# Patient Record
Sex: Female | Born: 1980
Health system: Southern US, Community
[De-identification: ages and names within clinical notes are randomized; demographics above are authoritative.]

## PROBLEM LIST (undated history)

## (undated) DIAGNOSIS — E039 Hypothyroidism, unspecified: Secondary | ICD-10-CM

---

## 2017-05-06 ENCOUNTER — Ambulatory Visit: Payer: Self-pay | Admitting: Family Medicine

## 2017-06-01 DIAGNOSIS — J209 Acute bronchitis, unspecified: Secondary | ICD-10-CM | POA: Diagnosis not present

## 2017-06-01 DIAGNOSIS — J300 Vasomotor rhinitis: Secondary | ICD-10-CM | POA: Diagnosis not present

## 2017-06-09 DIAGNOSIS — Z113 Encounter for screening for infections with a predominantly sexual mode of transmission: Secondary | ICD-10-CM | POA: Diagnosis not present

## 2017-06-09 DIAGNOSIS — Z1159 Encounter for screening for other viral diseases: Secondary | ICD-10-CM | POA: Diagnosis not present

## 2017-06-09 DIAGNOSIS — Z118 Encounter for screening for other infectious and parasitic diseases: Secondary | ICD-10-CM | POA: Diagnosis not present

## 2017-06-09 DIAGNOSIS — Z31430 Encounter of female for testing for genetic disease carrier status for procreative management: Secondary | ICD-10-CM | POA: Diagnosis not present

## 2017-09-13 DIAGNOSIS — E288 Other ovarian dysfunction: Secondary | ICD-10-CM | POA: Diagnosis not present

## 2017-11-12 DIAGNOSIS — E039 Hypothyroidism, unspecified: Secondary | ICD-10-CM | POA: Diagnosis not present

## 2017-11-12 DIAGNOSIS — Z3141 Encounter for fertility testing: Secondary | ICD-10-CM | POA: Diagnosis not present

## 2018-01-04 DIAGNOSIS — E039 Hypothyroidism, unspecified: Secondary | ICD-10-CM | POA: Diagnosis not present

## 2018-01-04 DIAGNOSIS — Z3141 Encounter for fertility testing: Secondary | ICD-10-CM | POA: Diagnosis not present

## 2018-01-25 DIAGNOSIS — E288 Other ovarian dysfunction: Secondary | ICD-10-CM | POA: Diagnosis not present

## 2018-03-14 DIAGNOSIS — O020 Blighted ovum and nonhydatidiform mole: Secondary | ICD-10-CM | POA: Diagnosis not present

## 2018-03-14 DIAGNOSIS — Z3200 Encounter for pregnancy test, result unknown: Secondary | ICD-10-CM | POA: Diagnosis not present

## 2018-03-28 DIAGNOSIS — O200 Threatened abortion: Secondary | ICD-10-CM | POA: Diagnosis not present

## 2018-04-01 DIAGNOSIS — O09529 Supervision of elderly multigravida, unspecified trimester: Secondary | ICD-10-CM | POA: Diagnosis not present

## 2018-04-01 DIAGNOSIS — O09899 Supervision of other high risk pregnancies, unspecified trimester: Secondary | ICD-10-CM | POA: Diagnosis not present

## 2018-04-01 DIAGNOSIS — Z36 Encounter for antenatal screening for chromosomal anomalies: Secondary | ICD-10-CM | POA: Diagnosis not present

## 2018-04-11 DIAGNOSIS — O200 Threatened abortion: Secondary | ICD-10-CM | POA: Diagnosis not present

## 2018-05-02 DIAGNOSIS — Z01419 Encounter for gynecological examination (general) (routine) without abnormal findings: Secondary | ICD-10-CM | POA: Diagnosis not present

## 2018-05-02 DIAGNOSIS — Z0000 Encounter for general adult medical examination without abnormal findings: Secondary | ICD-10-CM | POA: Diagnosis not present

## 2018-05-02 DIAGNOSIS — O0901 Supervision of pregnancy with history of infertility, first trimester: Secondary | ICD-10-CM | POA: Diagnosis not present

## 2018-05-02 DIAGNOSIS — Z118 Encounter for screening for other infectious and parasitic diseases: Secondary | ICD-10-CM | POA: Diagnosis not present

## 2018-05-02 DIAGNOSIS — O09511 Supervision of elderly primigravida, first trimester: Secondary | ICD-10-CM | POA: Diagnosis not present

## 2018-05-02 DIAGNOSIS — Z1151 Encounter for screening for human papillomavirus (HPV): Secondary | ICD-10-CM | POA: Diagnosis not present

## 2018-05-02 DIAGNOSIS — Z3689 Encounter for other specified antenatal screening: Secondary | ICD-10-CM | POA: Diagnosis not present

## 2018-05-02 DIAGNOSIS — Z3A13 13 weeks gestation of pregnancy: Secondary | ICD-10-CM | POA: Diagnosis not present

## 2018-05-02 DIAGNOSIS — O99281 Endocrine, nutritional and metabolic diseases complicating pregnancy, first trimester: Secondary | ICD-10-CM | POA: Diagnosis not present

## 2018-05-02 DIAGNOSIS — Z23 Encounter for immunization: Secondary | ICD-10-CM | POA: Diagnosis not present

## 2018-05-02 DIAGNOSIS — Z131 Encounter for screening for diabetes mellitus: Secondary | ICD-10-CM | POA: Diagnosis not present

## 2018-05-02 LAB — OB RESULTS CONSOLE GC/CHLAMYDIA
Chlamydia: NEGATIVE
Gonorrhea: NEGATIVE

## 2018-05-02 LAB — OB RESULTS CONSOLE RPR: RPR: NONREACTIVE

## 2018-05-02 LAB — OB RESULTS CONSOLE ABO/RH: RH Type: POSITIVE

## 2018-05-02 LAB — OB RESULTS CONSOLE HIV ANTIBODY (ROUTINE TESTING): HIV: NONREACTIVE

## 2018-05-02 LAB — OB RESULTS CONSOLE ANTIBODY SCREEN: Antibody Screen: NEGATIVE

## 2018-05-02 LAB — OB RESULTS CONSOLE RUBELLA ANTIBODY, IGM: Rubella: IMMUNE

## 2018-05-02 LAB — OB RESULTS CONSOLE HEPATITIS B SURFACE ANTIGEN: Hepatitis B Surface Ag: NEGATIVE

## 2018-05-20 DIAGNOSIS — Z3A16 16 weeks gestation of pregnancy: Secondary | ICD-10-CM | POA: Diagnosis not present

## 2018-05-20 DIAGNOSIS — O99281 Endocrine, nutritional and metabolic diseases complicating pregnancy, first trimester: Secondary | ICD-10-CM | POA: Diagnosis not present

## 2018-05-20 DIAGNOSIS — Z361 Encounter for antenatal screening for raised alphafetoprotein level: Secondary | ICD-10-CM | POA: Diagnosis not present

## 2018-05-20 DIAGNOSIS — O09512 Supervision of elderly primigravida, second trimester: Secondary | ICD-10-CM | POA: Diagnosis not present

## 2018-06-10 DIAGNOSIS — O09512 Supervision of elderly primigravida, second trimester: Secondary | ICD-10-CM | POA: Diagnosis not present

## 2018-06-10 DIAGNOSIS — O99281 Endocrine, nutritional and metabolic diseases complicating pregnancy, first trimester: Secondary | ICD-10-CM | POA: Diagnosis not present

## 2018-06-10 DIAGNOSIS — Z3A19 19 weeks gestation of pregnancy: Secondary | ICD-10-CM | POA: Diagnosis not present

## 2018-07-01 DIAGNOSIS — O99281 Endocrine, nutritional and metabolic diseases complicating pregnancy, first trimester: Secondary | ICD-10-CM | POA: Diagnosis not present

## 2018-07-01 DIAGNOSIS — Z3A22 22 weeks gestation of pregnancy: Secondary | ICD-10-CM | POA: Diagnosis not present

## 2018-07-01 DIAGNOSIS — O09512 Supervision of elderly primigravida, second trimester: Secondary | ICD-10-CM | POA: Diagnosis not present

## 2018-07-29 DIAGNOSIS — O99282 Endocrine, nutritional and metabolic diseases complicating pregnancy, second trimester: Secondary | ICD-10-CM | POA: Diagnosis not present

## 2018-07-29 DIAGNOSIS — Z3A26 26 weeks gestation of pregnancy: Secondary | ICD-10-CM | POA: Diagnosis not present

## 2018-07-29 DIAGNOSIS — O09512 Supervision of elderly primigravida, second trimester: Secondary | ICD-10-CM | POA: Diagnosis not present

## 2018-08-15 DIAGNOSIS — Z23 Encounter for immunization: Secondary | ICD-10-CM | POA: Diagnosis not present

## 2018-08-15 DIAGNOSIS — O99282 Endocrine, nutritional and metabolic diseases complicating pregnancy, second trimester: Secondary | ICD-10-CM | POA: Diagnosis not present

## 2018-08-15 DIAGNOSIS — Z3689 Encounter for other specified antenatal screening: Secondary | ICD-10-CM | POA: Diagnosis not present

## 2018-08-15 DIAGNOSIS — O2613 Low weight gain in pregnancy, third trimester: Secondary | ICD-10-CM | POA: Diagnosis not present

## 2018-08-15 DIAGNOSIS — O09512 Supervision of elderly primigravida, second trimester: Secondary | ICD-10-CM | POA: Diagnosis not present

## 2018-08-15 DIAGNOSIS — Z3A28 28 weeks gestation of pregnancy: Secondary | ICD-10-CM | POA: Diagnosis not present

## 2018-08-31 DIAGNOSIS — O99283 Endocrine, nutritional and metabolic diseases complicating pregnancy, third trimester: Secondary | ICD-10-CM | POA: Diagnosis not present

## 2018-08-31 DIAGNOSIS — Z3A31 31 weeks gestation of pregnancy: Secondary | ICD-10-CM | POA: Diagnosis not present

## 2018-09-15 DIAGNOSIS — O99283 Endocrine, nutritional and metabolic diseases complicating pregnancy, third trimester: Secondary | ICD-10-CM | POA: Diagnosis not present

## 2018-09-15 DIAGNOSIS — Z3A33 33 weeks gestation of pregnancy: Secondary | ICD-10-CM | POA: Diagnosis not present

## 2018-09-28 DIAGNOSIS — Z3A35 35 weeks gestation of pregnancy: Secondary | ICD-10-CM | POA: Diagnosis not present

## 2018-09-28 DIAGNOSIS — O99283 Endocrine, nutritional and metabolic diseases complicating pregnancy, third trimester: Secondary | ICD-10-CM | POA: Diagnosis not present

## 2018-09-28 DIAGNOSIS — O365930 Maternal care for other known or suspected poor fetal growth, third trimester, not applicable or unspecified: Secondary | ICD-10-CM | POA: Diagnosis not present

## 2018-09-28 DIAGNOSIS — Z3685 Encounter for antenatal screening for Streptococcus B: Secondary | ICD-10-CM | POA: Diagnosis not present

## 2018-09-28 LAB — OB RESULTS CONSOLE GBS: GBS: NEGATIVE

## 2018-10-06 DIAGNOSIS — Z3A36 36 weeks gestation of pregnancy: Secondary | ICD-10-CM | POA: Diagnosis not present

## 2018-10-06 DIAGNOSIS — O99283 Endocrine, nutritional and metabolic diseases complicating pregnancy, third trimester: Secondary | ICD-10-CM | POA: Diagnosis not present

## 2018-10-11 DIAGNOSIS — Z3A36 36 weeks gestation of pregnancy: Secondary | ICD-10-CM | POA: Diagnosis not present

## 2018-10-11 DIAGNOSIS — O99283 Endocrine, nutritional and metabolic diseases complicating pregnancy, third trimester: Secondary | ICD-10-CM | POA: Diagnosis not present

## 2018-10-13 ENCOUNTER — Inpatient Hospital Stay (HOSPITAL_COMMUNITY)
Admission: AD | Admit: 2018-10-13 | Discharge: 2018-10-13 | Disposition: A | Discharge status: Home / Self Care | Payer: BC Managed Care – PPO | Attending: Obstetrics & Gynecology | Admitting: Obstetrics & Gynecology

## 2018-10-13 ENCOUNTER — Other Ambulatory Visit: Payer: Self-pay

## 2018-10-13 ENCOUNTER — Telehealth (HOSPITAL_COMMUNITY): Payer: Self-pay | Admitting: *Deleted

## 2018-10-13 ENCOUNTER — Encounter (HOSPITAL_COMMUNITY): Payer: Self-pay | Admitting: *Deleted

## 2018-10-13 ENCOUNTER — Inpatient Hospital Stay (HOSPITAL_BASED_OUTPATIENT_CLINIC_OR_DEPARTMENT_OTHER): Payer: BC Managed Care – PPO

## 2018-10-13 DIAGNOSIS — O26893 Other specified pregnancy related conditions, third trimester: Secondary | ICD-10-CM

## 2018-10-13 DIAGNOSIS — R030 Elevated blood-pressure reading, without diagnosis of hypertension: Secondary | ICD-10-CM | POA: Insufficient documentation

## 2018-10-13 DIAGNOSIS — N939 Abnormal uterine and vaginal bleeding, unspecified: Secondary | ICD-10-CM

## 2018-10-13 DIAGNOSIS — Z3689 Encounter for other specified antenatal screening: Secondary | ICD-10-CM

## 2018-10-13 DIAGNOSIS — O09523 Supervision of elderly multigravida, third trimester: Secondary | ICD-10-CM

## 2018-10-13 DIAGNOSIS — O4693 Antepartum hemorrhage, unspecified, third trimester: Secondary | ICD-10-CM

## 2018-10-13 DIAGNOSIS — Z3A37 37 weeks gestation of pregnancy: Secondary | ICD-10-CM

## 2018-10-13 DIAGNOSIS — O368993 Maternal care for other specified fetal problems, unspecified trimester, fetus 3: Secondary | ICD-10-CM | POA: Diagnosis not present

## 2018-10-13 HISTORY — DX: Hypothyroidism, unspecified: E03.9

## 2018-10-13 LAB — COMPREHENSIVE METABOLIC PANEL
ALT: 13 U/L (ref 0–44)
AST: 16 U/L (ref 15–41)
Albumin: 2.8 g/dL — ABNORMAL LOW (ref 3.5–5.0)
Alkaline Phosphatase: 220 U/L — ABNORMAL HIGH (ref 38–126)
Anion gap: 6 (ref 5–15)
BUN: <5 mg/dL — ABNORMAL LOW (ref 6–20)
CO2: 23 mmol/L (ref 22–32)
Calcium: 8.9 mg/dL (ref 8.9–10.3)
Chloride: 105 mmol/L (ref 98–111)
Creatinine, Ser: 0.51 mg/dL (ref 0.44–1.00)
GFR calc Af Amer: >60 mL/min (ref >60)
GFR calc non Af Amer: >60 mL/min (ref >60)
Glucose, Bld: 85 mg/dL (ref 70–99)
Potassium: 4.5 mmol/L (ref 3.5–5.1)
Sodium: 134 mmol/L — ABNORMAL LOW (ref 135–145)
Total Bilirubin: 0.4 mg/dL (ref 0.3–1.2)
Total Protein: 6.2 g/dL — ABNORMAL LOW (ref 6.5–8.1)

## 2018-10-13 LAB — URINALYSIS, ROUTINE W REFLEX MICROSCOPIC
Bilirubin Urine: NEGATIVE
Glucose, UA: NEGATIVE mg/dL
Hgb urine dipstick: NEGATIVE
Ketones, ur: NEGATIVE mg/dL
Leukocytes,Ua: NEGATIVE
Nitrite: NEGATIVE
Protein, ur: NEGATIVE mg/dL
Specific Gravity, Urine: 1.005 (ref 1.005–1.030)
pH: 6 (ref 5.0–8.0)

## 2018-10-13 LAB — CBC WITH DIFFERENTIAL/PLATELET
Abs Immature Granulocytes: 0.02 10*3/uL (ref 0.00–0.07)
Basophils Absolute: 0 10*3/uL (ref 0.0–0.1)
Basophils Relative: 0 %
Eosinophils Absolute: 0.1 10*3/uL (ref 0.0–0.5)
Eosinophils Relative: 1 %
HCT: 35.4 % — ABNORMAL LOW (ref 36.0–46.0)
Hemoglobin: 11.7 g/dL — ABNORMAL LOW (ref 12.0–15.0)
Immature Granulocytes: 0 %
Lymphocytes Relative: 21 %
Lymphs Abs: 1.8 10*3/uL (ref 0.7–4.0)
MCH: 28.5 pg (ref 26.0–34.0)
MCHC: 33.1 g/dL (ref 30.0–36.0)
MCV: 86.3 fL (ref 80.0–100.0)
Monocytes Absolute: 0.8 10*3/uL (ref 0.1–1.0)
Monocytes Relative: 9 %
Neutro Abs: 6.1 10*3/uL (ref 1.7–7.7)
Neutrophils Relative %: 69 %
Platelets: 236 10*3/uL (ref 150–400)
RBC: 4.1 MIL/uL (ref 3.87–5.11)
RDW: 13.9 % (ref 11.5–15.5)
WBC: 8.8 10*3/uL (ref 4.0–10.5)
nRBC: 0 % (ref 0.0–0.2)

## 2018-10-13 LAB — PROTEIN / CREATININE RATIO, URINE
Creatinine, Urine: 33.01 mg/dL
Total Protein, Urine: <6 mg/dL

## 2018-10-13 NOTE — MAU Note (Signed)
Returned from [place], no pain, no further bleeding.

## 2018-10-13 NOTE — MAU Note (Signed)
.   Emily Davidson is a 38 y.o. at [redacted]w[redacted]d here in MAU reporting: vaginal bleeding that started this morning. States she soaked one pad earlier but is only having vaginal bleeding when she wipes.   Onset of complaint: this morning Pain score: 0 Vitals:   10/13/18 1438  BP: (!) 148/91  Pulse: 99  Resp: 16  Temp: 98 F (36.7 C)     [id number]  Lab orders placed from triage:

## 2018-10-13 NOTE — Discharge Instructions (Signed)

## 2018-10-13 NOTE — MAU Provider Note (Signed)
History     CSN: [id number]  Arrival date and time: 10/13/18 1418   First Provider Initiated Contact with Patient 10/13/18 1449      Chief Complaint  Patient presents with  . Vaginal Bleeding   HPI Lincoln Kleiner is a 38 y.o. G1P0 at [redacted]w[redacted]d who presents to MAU with chief complaint of vaginal bleeding x 1 episode. This is a new problem, onset today around 0500. Patient states she "woke up to pee" and "saturated the toilet paper" with blood when she wiped after voiding. She denies ongoing bleeding and has not noticed any additional bleeding on her underwear. She did not wear a pad or pantiliner. She denies recent sexual intercourse.   Patient also reports RLQ tenderness to self-palpation. She denies leaking of fluid, decreased fetal movement, fever, falls, or recent illness.    Patient's recent OB appointment was 10/11/2018 but she states she did not have a cervical exam at that time.  Patient's blood pressure is intermittently elevated in MAU. She denies history of elevated blood pressure. She denies headache, visual disturbances, RUQ/upper abdominal pain, new onset swelling or weight gain.  OB History    Gravida  1   Para      Term      Preterm      AB      Living        SAB      TAB      Ectopic      Multiple      Live Births              Past Medical History:  Diagnosis Date  . Hypothyroidism     History reviewed. No pertinent surgical history.  History reviewed. No pertinent family history.  Social History   Tobacco Use  . Smoking status: Never Smoker  . Smokeless tobacco: Never Used  Substance Use Topics  . Alcohol use: Never    Frequency: Never  . Drug use: Never    Allergies: No Known Allergies  Medications Prior to Admission  Medication Sig Dispense Refill Last Dose  . chlorproMAZINE (THORAZINE) 50 MG tablet Take 50 mg by mouth 3 (three) times daily as needed.   10/13/2018 at Unknown time  . Prenatal Vit-Fe Fumarate-FA (PRENATAL  MULTIVITAMIN) TABS tablet Take 1 tablet by mouth daily at 12 noon.   10/13/2018 at Unknown time    Review of Systems  Constitutional: Negative for chills, fatigue and fever.  Gastrointestinal: Positive for abdominal pain. Negative for nausea and vomiting.  Genitourinary: Positive for vaginal bleeding. Negative for difficulty urinating, dysuria, flank pain, vaginal discharge and vaginal pain.  Musculoskeletal: Negative for back pain.  Neurological: Negative for headaches.  All other systems reviewed and are negative.  Physical Exam   Blood pressure 130/87, pulse 99, temperature 98 F (36.7 C), resp. rate 16, height  (1.549 m), weight 76.7 kg.  Physical Exam  Nursing note and vitals reviewed. Constitutional: She is oriented to person, place, and time. She appears well-developed and well-nourished.  Cardiovascular: Normal rate.  Respiratory: Effort normal. No respiratory distress.  GI: She exhibits no distension. There is no abdominal tenderness. There is no rebound and no guarding.  Gravid  Genitourinary:    Vagina and uterus normal.     No vaginal discharge.     Genitourinary Comments: No bleeding or abnormal findings on pelvic exam   Neurological: She is alert and oriented to person, place, and time.  Skin: Skin is warm and dry.  Psychiatric: She has a normal mood and affect. Her behavior is normal. Judgment and thought content normal.    MAU Course/MDM  Procedures: sterile speculum exam  --Reactive tracing: baseline 120, mod variability, + accels, no decels --Toco: UI --Questionable decel at 1527, sent for BPP. BPP 8/8 --Closed cervix --Prenatal record reviewed by me. No history of elevated blood pressure  Patient Vitals for the past 24 hrs:  BP Temp Pulse Resp Height Weight  10/13/18 1613 (!) 143/82 - 80 - - -  10/13/18 1515 123/79 - 84 - - -  10/13/18 1501 - - 95 - - -  10/13/18 1500 (!) 109/51 - - - - -  10/13/18 1441 130/87 - - - - -  10/13/18 1438 (!) 148/91  98 F (36.7 C) 99 16 5\' 1"  (1.549 m) 76.7 kg    Results for orders placed or performed during the hospital encounter of 10/13/18 (from the past 24 hour(s))  Protein / creatinine ratio, urine     Status: None   Collection Time: 10/13/18  2:44 PM  Result Value Ref Range   Creatinine, Urine 33.01 mg/dL   Total Protein, Urine <6 mg/dL   Protein Creatinine Ratio        0.00 - 0.15 mg/mg[Cre]  Urinalysis, Routine w reflex microscopic     Status: Abnormal   Collection Time: 10/13/18  2:44 PM  Result Value Ref Range   Color, Urine STRAW (A) YELLOW   APPearance CLEAR CLEAR   Specific Gravity, Urine 1.005 1.005 - 1.030   pH 6.0 5.0 - 8.0   Glucose, UA NEGATIVE NEGATIVE mg/dL   Hgb urine dipstick NEGATIVE NEGATIVE   Bilirubin Urine NEGATIVE NEGATIVE   Ketones, ur NEGATIVE NEGATIVE mg/dL   Protein, ur NEGATIVE NEGATIVE mg/dL   Nitrite NEGATIVE NEGATIVE   Leukocytes,Ua NEGATIVE NEGATIVE  CBC with Differential/Platelet     Status: Abnormal   Collection Time: 10/13/18  3:10 PM  Result Value Ref Range   WBC 8.8 4.0 - 10.5 K/uL   RBC 4.10 3.87 - 5.11 MIL/uL   Hemoglobin 11.7 (L) 12.0 - 15.0 g/dL   HCT 19.135.4 (L) 47.836.0 - 29.546.0 %   MCV 86.3 80.0 - 100.0 fL   MCH 28.5 26.0 - 34.0 pg   MCHC 33.1 30.0 - 36.0 g/dL   RDW 62.113.9 30.811.5 - 65.715.5 %   Platelets 236 150 - 400 K/uL   nRBC 0.0 0.0 - 0.2 %   Neutrophils Relative % 69 %   Neutro Abs 6.1 1.7 - 7.7 K/uL   Lymphocytes Relative 21 %   Lymphs Abs 1.8 0.7 - 4.0 K/uL   Monocytes Relative 9 %   Monocytes Absolute 0.8 0.1 - 1.0 K/uL   Eosinophils Relative 1 %   Eosinophils Absolute 0.1 0.0 - 0.5 K/uL   Basophils Relative 0 %   Basophils Absolute 0.0 0.0 - 0.1 K/uL   Immature Granulocytes 0 %   Abs Immature Granulocytes 0.02 0.00 - 0.07 K/uL  Comprehensive metabolic panel     Status: Abnormal   Collection Time: 10/13/18  3:10 PM  Result Value Ref Range   Sodium 134 (L) 135 - 145 mmol/L   Potassium 4.5 3.5 - 5.1 mmol/L   Chloride 105 98 - 111 mmol/L    CO2 23 22 - 32 mmol/L   Glucose, Bld 85 70 - 99 mg/dL   BUN <5 (L) 6 - 20 mg/dL   Creatinine, Ser 8.460.51 0.44 - 1.00 mg/dL   Calcium 8.9 8.9 -  10.3 mg/dL   Total Protein 6.2 (L) 6.5 - 8.1 g/dL   Albumin 2.8 (L) 3.5 - 5.0 g/dL   AST 16 15 - 41 U/L   ALT 13 0 - 44 U/L   Alkaline Phosphatase 220 (H) 38 - 126 U/L   Total Bilirubin 0.4 0.3 - 1.2 mg/dL   GFR calc non Af Amer >60 >60 mL/min   GFR calc Af Amer >60 >60 mL/min   Anion gap 6 5 - 15    Assessment and Plan  --38 y.o. G1P0 at [redacted]w[redacted]d  --Reactive tracing --Elevated BP x 2 in MAU. Normal PEC labs, no severe signs or symptoms --BPP 8/8, no concerning findings on preliminary MFM report --Discharge home in stable condition, return for increasing acuity  F/U: Patient has OB appt 10/19/2018  Calvert Cantor 10/13/2018, 5:08 PM

## 2018-10-14 DIAGNOSIS — O99283 Endocrine, nutritional and metabolic diseases complicating pregnancy, third trimester: Secondary | ICD-10-CM | POA: Diagnosis not present

## 2018-10-14 DIAGNOSIS — Z3A37 37 weeks gestation of pregnancy: Secondary | ICD-10-CM | POA: Diagnosis not present

## 2018-10-17 ENCOUNTER — Encounter (HOSPITAL_COMMUNITY): Payer: Self-pay | Admitting: *Deleted

## 2018-10-17 ENCOUNTER — Inpatient Hospital Stay (HOSPITAL_COMMUNITY)
Admission: AD | Admit: 2018-10-17 | Discharge: 2018-10-21 | DRG: 788 | Disposition: A | Discharge status: Home / Self Care | Payer: BC Managed Care – PPO | Attending: Obstetrics & Gynecology | Admitting: Obstetrics & Gynecology

## 2018-10-17 ENCOUNTER — Other Ambulatory Visit: Payer: Self-pay

## 2018-10-17 DIAGNOSIS — O134 Gestational [pregnancy-induced] hypertension without significant proteinuria, complicating childbirth: Secondary | ICD-10-CM | POA: Diagnosis not present

## 2018-10-17 DIAGNOSIS — M25561 Pain in right knee: Secondary | ICD-10-CM | POA: Diagnosis not present

## 2018-10-17 DIAGNOSIS — E039 Hypothyroidism, unspecified: Secondary | ICD-10-CM | POA: Diagnosis not present

## 2018-10-17 DIAGNOSIS — O09513 Supervision of elderly primigravida, third trimester: Secondary | ICD-10-CM | POA: Diagnosis not present

## 2018-10-17 DIAGNOSIS — O9089 Other complications of the puerperium, not elsewhere classified: Secondary | ICD-10-CM | POA: Diagnosis not present

## 2018-10-17 DIAGNOSIS — O99284 Endocrine, nutritional and metabolic diseases complicating childbirth: Secondary | ICD-10-CM | POA: Diagnosis not present

## 2018-10-17 DIAGNOSIS — Z1159 Encounter for screening for other viral diseases: Secondary | ICD-10-CM | POA: Diagnosis not present

## 2018-10-17 DIAGNOSIS — O133 Gestational [pregnancy-induced] hypertension without significant proteinuria, third trimester: Secondary | ICD-10-CM | POA: Diagnosis not present

## 2018-10-17 DIAGNOSIS — O365930 Maternal care for other known or suspected poor fetal growth, third trimester, not applicable or unspecified: Secondary | ICD-10-CM | POA: Diagnosis not present

## 2018-10-17 DIAGNOSIS — Z3A37 37 weeks gestation of pregnancy: Secondary | ICD-10-CM

## 2018-10-17 DIAGNOSIS — Z98891 History of uterine scar from previous surgery: Secondary | ICD-10-CM

## 2018-10-17 DIAGNOSIS — O139 Gestational [pregnancy-induced] hypertension without significant proteinuria, unspecified trimester: Secondary | ICD-10-CM | POA: Diagnosis present

## 2018-10-17 DIAGNOSIS — O43813 Placental infarction, third trimester: Secondary | ICD-10-CM | POA: Diagnosis not present

## 2018-10-17 LAB — TYPE AND SCREEN
ABO/RH(D): B POS
Antibody Screen: NEGATIVE

## 2018-10-17 LAB — CBC
HCT: 37.3 % (ref 36.0–46.0)
HCT: 38.5 % (ref 36.0–46.0)
Hemoglobin: 12.5 g/dL (ref 12.0–15.0)
Hemoglobin: 12.7 g/dL (ref 12.0–15.0)
MCH: 28.9 pg (ref 26.0–34.0)
MCH: 28.6 pg (ref 26.0–34.0)
MCHC: 33.5 g/dL (ref 30.0–36.0)
MCHC: 33 g/dL (ref 30.0–36.0)
MCV: 86.3 fL (ref 80.0–100.0)
MCV: 86.7 fL (ref 80.0–100.0)
Platelets: 267 10*3/uL (ref 150–400)
Platelets: 239 10*3/uL (ref 150–400)
RBC: 4.32 MIL/uL (ref 3.87–5.11)
RBC: 4.44 MIL/uL (ref 3.87–5.11)
RDW: 13.8 % (ref 11.5–15.5)
RDW: 13.9 % (ref 11.5–15.5)
WBC: 9.7 10*3/uL (ref 4.0–10.5)
WBC: 9.8 10*3/uL (ref 4.0–10.5)
nRBC: 0 % (ref 0.0–0.2)
nRBC: 0 % (ref 0.0–0.2)

## 2018-10-17 LAB — COMPREHENSIVE METABOLIC PANEL
ALT: 14 U/L (ref 0–44)
AST: 20 U/L (ref 15–41)
Albumin: 2.9 g/dL — ABNORMAL LOW (ref 3.5–5.0)
Alkaline Phosphatase: 226 U/L — ABNORMAL HIGH (ref 38–126)
Anion gap: 10 (ref 5–15)
BUN: 6 mg/dL (ref 6–20)
CO2: 21 mmol/L — ABNORMAL LOW (ref 22–32)
Calcium: 9.5 mg/dL (ref 8.9–10.3)
Chloride: 104 mmol/L (ref 98–111)
Creatinine, Ser: 0.51 mg/dL (ref 0.44–1.00)
GFR calc Af Amer: >60 mL/min (ref >60)
GFR calc non Af Amer: >60 mL/min (ref >60)
Glucose, Bld: 89 mg/dL (ref 70–99)
Potassium: 4.5 mmol/L (ref 3.5–5.1)
Sodium: 135 mmol/L (ref 135–145)
Total Bilirubin: 0.3 mg/dL (ref 0.3–1.2)
Total Protein: 6.5 g/dL (ref 6.5–8.1)

## 2018-10-17 LAB — ABO/RH: ABO/RH(D): B POS

## 2018-10-17 LAB — PROTEIN / CREATININE RATIO, URINE
Creatinine, Urine: 34.24 mg/dL
Protein Creatinine Ratio: 0.26 mg/mg{Cre} — ABNORMAL HIGH (ref 0.00–0.15)
Total Protein, Urine: 9 mg/dL

## 2018-10-17 LAB — SARS CORONAVIRUS 2 BY RT PCR (HOSPITAL ORDER, PERFORMED IN ~~LOC~~ HOSPITAL LAB): SARS Coronavirus 2: NEGATIVE

## 2018-10-17 MED ORDER — ONDANSETRON HCL 4 MG/2ML IJ SOLN: 4.0000 mg | Freq: Four times a day (QID) | INTRAMUSCULAR | Status: DC | PRN

## 2018-10-17 MED ORDER — OXYTOCIN 40 UNITS IN NORMAL SALINE INFUSION - SIMPLE MED
1.0000 m[IU]/min | INTRAVENOUS | Status: DC
  Administered 2018-10-17: 23:00:00 2 m[IU]/min via INTRAVENOUS
  Filled 2018-10-17: qty 1000

## 2018-10-17 MED ORDER — SOD CITRATE-CITRIC ACID 500-334 MG/5ML PO SOLN
30.0000 mL | ORAL | Status: DC | PRN
  Administered 2018-10-18: 01:00:00 30 mL via ORAL
  Filled 2018-10-17: qty 30

## 2018-10-17 MED ORDER — LACTATED RINGERS IV SOLN
INTRAVENOUS | Status: DC
  Administered 2018-10-17 – 2018-10-18 (×4): via INTRAVENOUS

## 2018-10-17 MED ORDER — PROMETHAZINE HCL 25 MG/ML IJ SOLN
12.5000 mg | Freq: Four times a day (QID) | INTRAMUSCULAR | Status: DC | PRN
  Administered 2018-10-17: 20:00:00 12.5 mg via INTRAVENOUS
  Filled 2018-10-17: qty 1

## 2018-10-17 MED ORDER — MISOPROSTOL 25 MCG QUARTER TABLET
25.0000 ug | ORAL_TABLET | ORAL | Status: DC | PRN
  Administered 2018-10-17 (×2): 25 ug via VAGINAL
  Filled 2018-10-17 (×2): qty 1

## 2018-10-17 MED ORDER — EPHEDRINE 5 MG/ML INJ: 10.0000 mg | INTRAVENOUS | Status: DC | PRN

## 2018-10-17 MED ORDER — LIDOCAINE HCL (PF) 1 % IJ SOLN: 30.0000 mL | INTRAMUSCULAR | Status: DC | PRN

## 2018-10-17 MED ORDER — OXYCODONE-ACETAMINOPHEN 5-325 MG PO TABS: 1.0000 | ORAL_TABLET | ORAL | Status: DC | PRN

## 2018-10-17 MED ORDER — LACTATED RINGERS IV SOLN
500.0000 mL | INTRAVENOUS | Status: DC | PRN
  Administered 2018-10-17: 16:00:00 500 mL via INTRAVENOUS

## 2018-10-17 MED ORDER — OXYTOCIN 40 UNITS IN NORMAL SALINE INFUSION - SIMPLE MED: 2.5000 [IU]/h | INTRAVENOUS | Status: DC

## 2018-10-17 MED ORDER — NALBUPHINE HCL 10 MG/ML IJ SOLN
10.0000 mg | INTRAMUSCULAR | Status: DC | PRN
  Administered 2018-10-17: 20:00:00 10 mg via INTRAVENOUS
  Filled 2018-10-17: qty 1

## 2018-10-17 MED ORDER — LACTATED RINGERS IV SOLN: 500.0000 mL | Freq: Once | INTRAVENOUS | Status: DC

## 2018-10-17 MED ORDER — PHENYLEPHRINE 40 MCG/ML (10ML) SYRINGE FOR IV PUSH (FOR BLOOD PRESSURE SUPPORT): 80.0000 ug | PREFILLED_SYRINGE | INTRAVENOUS | Status: DC | PRN

## 2018-10-17 MED ORDER — FENTANYL-BUPIVACAINE-NACL 0.5-0.125-0.9 MG/250ML-% EP SOLN: 12.0000 mL/h | EPIDURAL | Status: DC | PRN

## 2018-10-17 MED ORDER — TERBUTALINE SULFATE 1 MG/ML IJ SOLN: 0.2500 mg | Freq: Once | INTRAMUSCULAR | Status: DC | PRN

## 2018-10-17 MED ORDER — OXYCODONE-ACETAMINOPHEN 5-325 MG PO TABS: 2.0000 | ORAL_TABLET | ORAL | Status: DC | PRN

## 2018-10-17 MED ORDER — OXYTOCIN BOLUS FROM INFUSION: 500.0000 mL | Freq: Once | INTRAVENOUS | Status: DC

## 2018-10-17 MED ORDER — ACETAMINOPHEN 325 MG PO TABS: 650.0000 mg | ORAL_TABLET | ORAL | Status: DC | PRN

## 2018-10-17 MED ORDER — DIPHENHYDRAMINE HCL 50 MG/ML IJ SOLN: 12.5000 mg | INTRAMUSCULAR | Status: DC | PRN

## 2018-10-17 NOTE — Progress Notes (Signed)
No complaints  BP 140/77   Pulse 71   Temp (!) 97.2 F (36.2 C) (Axillary)   Resp 18  Patient Vitals for the past 24 hrs:  BP Temp Temp src Pulse Resp  10/17/18 1705 140/77 - - 71 -  10/17/18 1700 - - - - 18  10/17/18 1500 126/79 (!) 97.2 F (36.2 C) Axillary 67 20  10/17/18 1354 (!) 146/80 (!) 97 F (36.1 C) Oral 72 20  10/17/18 1344 - - - - 18  10/17/18 1340 (!) 135/92 - - 79 18   CBC Latest Ref Rng & Units 10/17/2018 10/13/2018  WBC 4.0 - 10.5 K/uL 9.7 8.8  Hemoglobin 12.0 - 15.0 g/dL 12.5 11.7(L)  Hematocrit 36.0 - 46.0 % 37.3 35.4(L)  Platelets 150 - 400 K/uL 267 236    CMP Latest Ref Rng & Units 10/17/2018 10/13/2018  Glucose 70 - 99 mg/dL 89 85  BUN 6 - 20 mg/dL 6 <5(L)  Creatinine 0.44 - 1.00 mg/dL 0.51 0.51  Sodium 135 - 145 mmol/L 135 134(L)  Potassium 3.5 - 5.1 mmol/L 4.5 4.5  Chloride 98 - 111 mmol/L 104 105  CO2 22 - 32 mmol/L 21(L) 23  Calcium 8.9 - 10.3 mg/dL 9.5 8.9  Total Protein 6.5 - 8.1 g/dL 6.5 6.2(L)  Total Bilirubin 0.3 - 1.2 mg/dL 0.3 0.4  Alkaline Phos 38 - 126 U/L 226(H) 220(H)  AST 15 - 41 U/L 20 16  ALT 0 - 44 U/L 14 13   FHT one spontaneous 3 min decel around 3.45 pm, 3 minutes, after RTB normal moderate variability and accels and no further decels noted. Overall cat I Toco none  SVE def- cx closed at admission, cytotec placed by RN  A/P: IOL for 38 yo, G1 with GHTN.  PIH labs nl, no PEC. No neural s/s.  Borderline pelvis

## 2018-10-17 NOTE — Progress Notes (Signed)
No complaints BP (!) 148/82   Pulse 70   Temp (!) 97.1 F (36.2 C) (Axillary)   Resp 20   S/p 2nd dose of Cytotec around 6.30 pm.   FHT 140s + accels, one more variable decel down to 70 lasting 1 min. Since then variability moderate, overall cat I  SVE cytotec placed by RN, reports difficult exam, high station   A/P: IOL for 38 yo, G1 with GHTN. FHT cat I  PIH labs nl, no PEC. No neural s/s.  Borderline pelvis  V.Juaquin Ludington MD

## 2018-10-17 NOTE — Progress Notes (Signed)
FHT reviewed 120s, occ variable decels and 2 non recurrent late decels, variability minimal since Nubain given. Will continue to closely monitor FHT.

## 2018-10-17 NOTE — H&P (Addendum)
Emily HareRashi Davidson is a 38 y.o. female presenting for labor IOL for GestHTN 3rd trim bleeding without trauma or intercourse on 6/4, was sen to MAU, NST noted one variable decel, BPP 8/8, AFI nl. Some BPs in 140s/ 80s She was seen back in office on 6/5, NST reactive but variability 10/x10 beat. Bleeding was only brown. BP a bit elevated in 140s/ 80s, but repeat nl.  She was seen today- NST reactive. Sono - AFI nl but placenta grade III but BP elevated again 140/91, 144/92.   38 yo G1. Low egg reserve, [facility] One failed IVF. Then spontaneous conception following intraovarian stem cell injection. Was on Bromocriptine and Prometrium, latter continued till 12 wks.  Hypothyroid, on Synthroid with labs each trimester and normal.  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 Normal Dopplers, BPP, wkly NSTs since.  Low weight gain in pregnancy   OB History    Gravida  1   Para      Term      Preterm      AB      Living        SAB      TAB      Ectopic      Multiple      Live Births             Past Medical History:  Diagnosis Date  . Hypothyroidism    History reviewed. No pertinent surgical history. Family History: family history is not on file. Social History:  reports that she has never smoked. She has never used smokeless tobacco. She reports that she does not drink alcohol or use drugs.     Maternal Diabetes: No Genetic Screening: Normal Maternity 1321 XY, diploid Maternal Ultrasounds/Referrals: Normal- marginal cord. [facility], had Fetal echo and was normal Fetal Ultrasounds or other Referrals:  Fetal echo Maternal Substance Abuse:  No Significant Maternal Medications:  Meds include: Progesterone Syntroid  Significant Maternal Lab  Results:  Lab values include: Group B Strep negative Other Comments:  None  ROS History   There were no vitals taken for this visit. Exam Physical Exam  BP (!) 135/92 (BP Location: Right Arm)   Pulse 79   Resp 18   Physical exam:  A&O x 3, no acute distress. Pleasant HEENT neg, no thyromegaly Lungs CTA bilat CV RRR, S1S2 normal Abdo soft, non tender, non acute Extr no edema/ tenderness Pelvic closed, high station, BORDERLINE pelvis  FHT  120s + accels no decels but variability is minimal, overall still cat I  Toco rare  Prenatal labs: ABO, Rh: B/Positive/-- (12/23 0000) Antibody: Negative (12/23 0000) Rubella: Immune (12/23 0000) RPR: Nonreactive (12/23 0000)  HBsAg: Negative (12/23 0000)  HIV: Non-reactive (12/23 0000)  GBS: Negative (05/20 0000)  Maternity 21 nl  AFP1 nl   Assessment/Plan: 38 yo G1 at 37.5 wks, with gestational HTN.  Grade 3 placenta today with lagging fetal growth with AC 2% at 35 wks, vaginal bleeding on 6/4.  Considering all of the above, IOL was advised and she agrees.  Start with Cytotec and watch FHT closely.  Borderline pelvic, increased C-section risk reviewed. Will assess after epidural since patient unable to relax for exams.  FHT cat I  PIH labs.  Elveria Royals 10/17/2018, 1:39 PM

## 2018-10-18 ENCOUNTER — Inpatient Hospital Stay (HOSPITAL_COMMUNITY): Payer: BC Managed Care – PPO | Admitting: Anesthesiology

## 2018-10-18 ENCOUNTER — Encounter (HOSPITAL_COMMUNITY): Payer: Self-pay | Admitting: Anesthesiology

## 2018-10-18 ENCOUNTER — Encounter (HOSPITAL_COMMUNITY)
Admission: AD | Disposition: A | Discharge status: Home / Self Care | Payer: Self-pay | Source: Home / Self Care | Attending: Obstetrics & Gynecology

## 2018-10-18 DIAGNOSIS — Z98891 History of uterine scar from previous surgery: Secondary | ICD-10-CM

## 2018-10-18 LAB — CBC
HCT: 34.7 % — ABNORMAL LOW (ref 36.0–46.0)
Hemoglobin: 11.6 g/dL — ABNORMAL LOW (ref 12.0–15.0)
MCH: 29.2 pg (ref 26.0–34.0)
MCHC: 33.4 g/dL (ref 30.0–36.0)
MCV: 87.4 fL (ref 80.0–100.0)
Platelets: 232 10*3/uL (ref 150–400)
RBC: 3.97 MIL/uL (ref 3.87–5.11)
RDW: 13.9 % (ref 11.5–15.5)
WBC: 13.5 10*3/uL — ABNORMAL HIGH (ref 4.0–10.5)
nRBC: 0 % (ref 0.0–0.2)

## 2018-10-18 LAB — COMPREHENSIVE METABOLIC PANEL
ALT: 13 U/L (ref 0–44)
AST: 19 U/L (ref 15–41)
Albumin: 2.4 g/dL — ABNORMAL LOW (ref 3.5–5.0)
Alkaline Phosphatase: 178 U/L — ABNORMAL HIGH (ref 38–126)
Anion gap: 9 (ref 5–15)
BUN: 6 mg/dL (ref 6–20)
CO2: 22 mmol/L (ref 22–32)
Calcium: 8.5 mg/dL — ABNORMAL LOW (ref 8.9–10.3)
Chloride: 104 mmol/L (ref 98–111)
Creatinine, Ser: 0.54 mg/dL (ref 0.44–1.00)
GFR calc Af Amer: >60 mL/min (ref >60)
GFR calc non Af Amer: >60 mL/min (ref >60)
Glucose, Bld: 136 mg/dL — ABNORMAL HIGH (ref 70–99)
Potassium: 4.2 mmol/L (ref 3.5–5.1)
Sodium: 135 mmol/L (ref 135–145)
Total Bilirubin: 0.5 mg/dL (ref 0.3–1.2)
Total Protein: 5.4 g/dL — ABNORMAL LOW (ref 6.5–8.1)

## 2018-10-18 LAB — RPR: RPR Ser Ql: NONREACTIVE

## 2018-10-18 SURGERY — Surgical Case
Anesthesia: Spinal

## 2018-10-18 MED ORDER — ACETAMINOPHEN 160 MG/5ML PO SOLN: 325.0000 mg | ORAL | Status: DC | PRN

## 2018-10-18 MED ORDER — ACETAMINOPHEN 325 MG PO TABS
650.0000 mg | ORAL_TABLET | ORAL | Status: DC | PRN
  Administered 2018-10-18 – 2018-10-20 (×5): 650 mg via ORAL
  Filled 2018-10-18 (×5): qty 2

## 2018-10-18 MED ORDER — SIMETHICONE 80 MG PO CHEW
80.0000 mg | CHEWABLE_TABLET | ORAL | Status: DC
  Administered 2018-10-18 – 2018-10-21 (×3): 80 mg via ORAL
  Filled 2018-10-18 (×3): qty 1

## 2018-10-18 MED ORDER — FENTANYL CITRATE (PF) 100 MCG/2ML IJ SOLN
INTRAMUSCULAR | Status: AC
  Filled 2018-10-18: qty 2

## 2018-10-18 MED ORDER — ZOLPIDEM TARTRATE 5 MG PO TABS: 5.0000 mg | ORAL_TABLET | Freq: Every evening | ORAL | Status: DC | PRN

## 2018-10-18 MED ORDER — SIMETHICONE 80 MG PO CHEW: 80.0000 mg | CHEWABLE_TABLET | ORAL | Status: DC | PRN

## 2018-10-18 MED ORDER — DIPHENHYDRAMINE HCL 25 MG PO CAPS: 25.0000 mg | ORAL_CAPSULE | Freq: Four times a day (QID) | ORAL | Status: DC | PRN

## 2018-10-18 MED ORDER — DEXAMETHASONE SODIUM PHOSPHATE 10 MG/ML IJ SOLN
INTRAMUSCULAR | Status: DC | PRN
  Administered 2018-10-18: 10 mg via INTRAVENOUS

## 2018-10-18 MED ORDER — LEVOTHYROXINE SODIUM 50 MCG PO TABS
50.0000 ug | ORAL_TABLET | Freq: Every day | ORAL | Status: DC
  Administered 2018-10-18 – 2018-10-21 (×4): 50 ug via ORAL
  Filled 2018-10-18 (×4): qty 1

## 2018-10-18 MED ORDER — SODIUM CHLORIDE 0.9 % IV SOLN
INTRAVENOUS | Status: DC | PRN
  Administered 2018-10-18: 40 [IU] via INTRAVENOUS

## 2018-10-18 MED ORDER — CEFAZOLIN SODIUM-DEXTROSE 2-4 GM/100ML-% IV SOLN
INTRAVENOUS | Status: AC
  Filled 2018-10-18: qty 100

## 2018-10-18 MED ORDER — MEPERIDINE HCL 25 MG/ML IJ SOLN: 6.2500 mg | INTRAMUSCULAR | Status: DC | PRN

## 2018-10-18 MED ORDER — SIMETHICONE 80 MG PO CHEW
80.0000 mg | CHEWABLE_TABLET | Freq: Three times a day (TID) | ORAL | Status: DC
  Administered 2018-10-18 – 2018-10-20 (×8): 80 mg via ORAL
  Filled 2018-10-18 (×8): qty 1

## 2018-10-18 MED ORDER — PHENYLEPHRINE HCL-NACL 20-0.9 MG/250ML-% IV SOLN
INTRAVENOUS | Status: DC | PRN
  Administered 2018-10-18: 60 ug/min via INTRAVENOUS

## 2018-10-18 MED ORDER — SENNOSIDES-DOCUSATE SODIUM 8.6-50 MG PO TABS
2.0000 | ORAL_TABLET | ORAL | Status: DC
  Administered 2018-10-18 – 2018-10-21 (×3): 2 via ORAL
  Filled 2018-10-18 (×4): qty 2

## 2018-10-18 MED ORDER — WITCH HAZEL-GLYCERIN EX PADS: 1.0000 "application " | MEDICATED_PAD | CUTANEOUS | Status: DC | PRN

## 2018-10-18 MED ORDER — MORPHINE SULFATE (PF) 0.5 MG/ML IJ SOLN
INTRAMUSCULAR | Status: AC
  Filled 2018-10-18: qty 10

## 2018-10-18 MED ORDER — DIBUCAINE (PERIANAL) 1 % EX OINT: 1.0000 "application " | TOPICAL_OINTMENT | CUTANEOUS | Status: DC | PRN

## 2018-10-18 MED ORDER — BUPIVACAINE IN DEXTROSE 0.75-8.25 % IT SOLN
INTRATHECAL | Status: DC | PRN
  Administered 2018-10-18: 1.5 mL via INTRATHECAL

## 2018-10-18 MED ORDER — CEFAZOLIN SODIUM-DEXTROSE 2-4 GM/100ML-% IV SOLN: 2.0000 g | INTRAVENOUS | Status: DC

## 2018-10-18 MED ORDER — COCONUT OIL OIL
1.0000 "application " | TOPICAL_OIL | Status: DC | PRN
  Administered 2018-10-20: 1 via TOPICAL

## 2018-10-18 MED ORDER — LACTATED RINGERS IV SOLN
INTRAVENOUS | Status: DC
  Administered 2018-10-18: 16:00:00 via INTRAVENOUS

## 2018-10-18 MED ORDER — ONDANSETRON HCL 4 MG/2ML IJ SOLN: 4.0000 mg | Freq: Once | INTRAMUSCULAR | Status: DC | PRN

## 2018-10-18 MED ORDER — OXYTOCIN 40 UNITS IN NORMAL SALINE INFUSION - SIMPLE MED
INTRAVENOUS | Status: AC
  Filled 2018-10-18: qty 1000

## 2018-10-18 MED ORDER — ONDANSETRON HCL 4 MG/2ML IJ SOLN
INTRAMUSCULAR | Status: AC
  Filled 2018-10-18: qty 2

## 2018-10-18 MED ORDER — MENTHOL 3 MG MT LOZG: 1.0000 | LOZENGE | OROMUCOSAL | Status: DC | PRN

## 2018-10-18 MED ORDER — DEXAMETHASONE SODIUM PHOSPHATE 10 MG/ML IJ SOLN
INTRAMUSCULAR | Status: AC
  Filled 2018-10-18: qty 1

## 2018-10-18 MED ORDER — ONDANSETRON HCL 4 MG/2ML IJ SOLN
INTRAMUSCULAR | Status: DC | PRN
  Administered 2018-10-18: 4 mg via INTRAVENOUS

## 2018-10-18 MED ORDER — SODIUM CHLORIDE 0.9 % IV SOLN
INTRAVENOUS | Status: DC | PRN
  Administered 2018-10-18: 02:00:00 via INTRAVENOUS

## 2018-10-18 MED ORDER — ACETAMINOPHEN 325 MG PO TABS: 325.0000 mg | ORAL_TABLET | ORAL | Status: DC | PRN

## 2018-10-18 MED ORDER — PRENATAL MULTIVITAMIN CH
1.0000 | ORAL_TABLET | Freq: Every day | ORAL | Status: DC
  Administered 2018-10-18 – 2018-10-20 (×3): 1 via ORAL
  Filled 2018-10-18 (×3): qty 1

## 2018-10-18 MED ORDER — CEFAZOLIN SODIUM-DEXTROSE 2-3 GM-%(50ML) IV SOLR
INTRAVENOUS | Status: DC | PRN
  Administered 2018-10-18: 2 g via INTRAVENOUS

## 2018-10-18 MED ORDER — FENTANYL CITRATE (PF) 100 MCG/2ML IJ SOLN
INTRAMUSCULAR | Status: DC | PRN
  Administered 2018-10-18: 15 ug via INTRATHECAL

## 2018-10-18 MED ORDER — OXYCODONE HCL 5 MG PO TABS: 5.0000 mg | ORAL_TABLET | Freq: Once | ORAL | Status: DC | PRN

## 2018-10-18 MED ORDER — LIDOCAINE-EPINEPHRINE (PF) 2 %-1:200000 IJ SOLN
INTRAMUSCULAR | Status: AC
  Filled 2018-10-18: qty 10

## 2018-10-18 MED ORDER — MORPHINE SULFATE (PF) 0.5 MG/ML IJ SOLN
INTRAMUSCULAR | Status: DC | PRN
  Administered 2018-10-18: .15 mg via INTRATHECAL

## 2018-10-18 MED ORDER — OXYCODONE HCL 5 MG PO TABS
5.0000 mg | ORAL_TABLET | ORAL | Status: DC | PRN
  Administered 2018-10-19 – 2018-10-21 (×6): 5 mg via ORAL
  Filled 2018-10-18 (×7): qty 1

## 2018-10-18 MED ORDER — IBUPROFEN 600 MG PO TABS
600.0000 mg | ORAL_TABLET | Freq: Four times a day (QID) | ORAL | Status: AC | PRN
  Administered 2018-10-18 – 2018-10-20 (×8): 600 mg via ORAL
  Filled 2018-10-18 (×8): qty 1

## 2018-10-18 MED ORDER — PHENYLEPHRINE HCL-NACL 20-0.9 MG/250ML-% IV SOLN
INTRAVENOUS | Status: AC
  Filled 2018-10-18: qty 500

## 2018-10-18 MED ORDER — FENTANYL CITRATE (PF) 100 MCG/2ML IJ SOLN: 25.0000 ug | INTRAMUSCULAR | Status: DC | PRN

## 2018-10-18 MED ORDER — OXYTOCIN 40 UNITS IN NORMAL SALINE INFUSION - SIMPLE MED: 2.5000 [IU]/h | INTRAVENOUS | Status: AC

## 2018-10-18 MED ORDER — TETANUS-DIPHTH-ACELL PERTUSSIS 5-2.5-18.5 LF-MCG/0.5 IM SUSP: 0.5000 mL | Freq: Once | INTRAMUSCULAR | Status: DC

## 2018-10-18 MED ORDER — OXYCODONE HCL 5 MG/5ML PO SOLN: 5.0000 mg | Freq: Once | ORAL | Status: DC | PRN

## 2018-10-18 SURGICAL SUPPLY — 38 items
BARRIER ADHS 3X4 INTERCEED (GAUZE/BANDAGES/DRESSINGS) ×3 IMPLANT
BENZOIN TINCTURE PRP APPL 2/3 (GAUZE/BANDAGES/DRESSINGS) ×3 IMPLANT
CHLORAPREP W/TINT 26ML (MISCELLANEOUS) ×3 IMPLANT
CLAMP CORD UMBIL (MISCELLANEOUS) IMPLANT
CLOSURE STERI STRIP 1/2 X4 (GAUZE/BANDAGES/DRESSINGS) ×3 IMPLANT
CLOSURE WOUND 1/2 X4 (GAUZE/BANDAGES/DRESSINGS)
CLOTH BEACON ORANGE TIMEOUT ST (SAFETY) ×3 IMPLANT
DRSG OPSITE POSTOP 4X10 (GAUZE/BANDAGES/DRESSINGS) ×3 IMPLANT
ELECT REM PT RETURN 9FT ADLT (ELECTROSURGICAL) ×3
ELECTRODE REM PT RTRN 9FT ADLT (ELECTROSURGICAL) ×1 IMPLANT
EXTRACTOR VACUUM KIWI (MISCELLANEOUS) IMPLANT
EXTRACTOR VACUUM M CUP 4 TUBE (SUCTIONS) IMPLANT
EXTRACTOR VACUUM M CUP 4' TUBE (SUCTIONS)
GLOVE BIO SURGEON STRL SZ7 (GLOVE) ×3 IMPLANT
GLOVE BIOGEL PI IND STRL 7.0 (GLOVE) ×2 IMPLANT
GLOVE BIOGEL PI INDICATOR 7.0 (GLOVE) ×4
GOWN STRL REUS W/TWL LRG LVL3 (GOWN DISPOSABLE) ×6 IMPLANT
KIT ABG SYR 3ML LUER SLIP (SYRINGE) IMPLANT
NEEDLE HYPO 25X5/8 SAFETYGLIDE (NEEDLE) IMPLANT
NS IRRIG 1000ML POUR BTL (IV SOLUTION) ×3 IMPLANT
PACK C SECTION WH (CUSTOM PROCEDURE TRAY) ×3 IMPLANT
PAD OB MATERNITY 4.3X12.25 (PERSONAL CARE ITEMS) ×3 IMPLANT
RTRCTR C-SECT PINK 25CM LRG (MISCELLANEOUS) IMPLANT
STRIP CLOSURE SKIN 1/2X4 (GAUZE/BANDAGES/DRESSINGS) IMPLANT
SUT MNCRL 0 VIOLET CTX 36 (SUTURE) ×2 IMPLANT
SUT MONOCRYL 0 CTX 36 (SUTURE) ×4
SUT PLAIN 0 NONE (SUTURE) IMPLANT
SUT PLAIN 2 0 (SUTURE) ×2
SUT PLAIN ABS 2-0 CT1 27XMFL (SUTURE) ×1 IMPLANT
SUT VIC AB 0 CT1 27 (SUTURE) ×4
SUT VIC AB 0 CT1 27XBRD ANBCTR (SUTURE) ×2 IMPLANT
SUT VIC AB 2-0 CT1 27 (SUTURE) ×2
SUT VIC AB 2-0 CT1 TAPERPNT 27 (SUTURE) ×1 IMPLANT
SUT VIC AB 4-0 KS 27 (SUTURE) ×3 IMPLANT
SUT VICRYL 0 TIES 12 18 (SUTURE) IMPLANT
TOWEL OR 17X24 6PK STRL BLUE (TOWEL DISPOSABLE) ×3 IMPLANT
TRAY FOLEY W/BAG SLVR 14FR LF (SET/KITS/TRAYS/PACK) IMPLANT
WATER STERILE IRR 1000ML POUR (IV SOLUTION) ×3 IMPLANT

## 2018-10-18 NOTE — Progress Notes (Signed)
Patient ID: Emily Davidson, female   DOB: [birth year], 38 y.o.   MRN: [id number] FHT with repetitive variable decels and some late decels, moderate variability but remote from delivery, Cx not dilated, SROM at 12 am,  Pt with GHTN, marginal cord, lagging fetal growth, grade 3 placenta, AMA at 69yrs.  NKDAs, Proceed with C-section Risks/complications of surgery reviewed incl infection, bleeding, damage to internal organs including bladder, bowels, ureters, blood vessels, other risks from anesthesia, VTE and delayed complications of any surgery, complications in future surgery reviewed. Also discussed neonatal complications incl difficult delivery, laceration, vacuum assistance, TTN etc. Pt understands and agrees, all concerns addressed.    V.Temeka Pore MD

## 2018-10-18 NOTE — Op Note (Signed)
Cesarean Section Procedure Note   Emily Davidson  10/18/2018  Indications: fetal intolerance to labor. repetitive decelerations, remote from delivery with closed cervix, borderline pelvis. Gestational HTN with induction at 37.5 wks. Marginal cord, AMA, lagging fetal growth and grade 3 placenta.    Pre-operative Diagnosis: fetal intolerance to labor. 37.6 weeks, AMA  Post-operative Diagnosis: Same   Surgeon: Azucena Fallen, MD    Assistants: Nira Conn, CNM   Anesthesia: spinal   Procedure Details:  The patient was seen in the Labor Room. The risks, benefits, complications, treatment options, and expected outcomes were discussed with the patient. The patient concurred with the proposed plan, giving informed consent. identified as [name] and the procedure verified as C-Section Delivery. A Time Out was held and the above information confirmed. 2 gm Ancef given.  After induction of anesthesia, the patient was draped and prepped in the usual sterile manner, foley was draining urine well.  A pfannenstiel incision was made and carried down through the subcutaneous tissue to the fascia. Fascial incision was made and extended transversely. The fascia was separated from the underlying rectus tissue superiorly and inferiorly. The peritoneum was identified and entered. Peritoneal incision was extended longitudinally. Anterior lower segment noted some bleeding uterine implants suggesting pelvic endometriosis. Alexis-O retractor placed. The utero-vesical peritoneal reflection was incised transversely and the bladder flap was bluntly freed from the lower uterine segment. A low transverse uterine incision was made. Clear amniotic fluid drained. Delivered from cephalic presentation,was a FEMALE infant with vigorous cry, SGA appearance. Apgar scores of 9 at one minute and 9 at five minutes. Delayed cord clamping done at 1 minute and baby handed to NICU team in attendance. Cord ph was not sent. Cord  blood was obtained for evaluation. There was no nuchal cord.  The placenta was removed Intact and appeared small, marginal cord noted. The uterine outline appeared normal. Ovaries and both fallopian tubes were not seen well, suspect posterior adhesions but ovaries palpated normal in size.  The uterine incision was closed with running locked sutures of 0Monocryl. A second imbricating layer sutured.   Hemostasis was observed. Anterior uterine blebby bleeding spots were cauterized. Interceed placed on incision. Alexis retractor removed. Peritoneal closure done with 2-0 Vicryl.  The fascia was then reapproximated with running sutures of 0Vicryl. The subcuticular closure was performed using 2-0plain gut. The skin was closed with 4-0Vicryl.   Instrument, sponge, and needle counts were correct prior the abdominal closure and were correct at the conclusion of the case.   Findings: Female infant delivered from [facility] hysterotomy, 2 layer closure done. Baby was at the pelvic inlet with ears at the level of incision making delivery easy. No nuchal cord noted. Placenta was small. Ovaries and fimbria of tubes not seen well due to suspected posterior adhesions, anterior uterine surface had bloody bleb like spots and there was blood in the peritoneal cavity in the pelvic area, so I suspect she had pelvic adhesions, possibly endometriosis.  Apgars 9 and 9, baby appears SGA, weight pending    Estimated Blood Loss: 202 mL   Total IV Fluids: LR 2061ml   Urine Output: 450 CC OF clear urine  Specimens: cord blood and placenta.   Complications: no complications  Disposition: PACU - hemodynamically stable.   Maternal Condition: stable   Baby condition / location:  Couplet care / Skin to Skin  Attending Attestation: I performed the procedure.   Signed: Surgeon(s): Azucena Fallen, MD

## 2018-10-18 NOTE — Progress Notes (Signed)
CSW received consult due to score 14 on Edinburgh Depression Screen.    CSW went to speak with MOB at bedside to address further needs and concerns. Upon entering the room CSW observed that MOB was lying in bed holding infant while FOB stood up staring out of window. CSW congratulated MOB and FOB on the birth of infant. CSW advised MOB of the HIPPA policy. MOB asked that FOB remain in room while CSW spoke with MOB. CSW understanding and proceeded with assessing for further needs.   CSW advised MOB of the reason for the visit. MOB expressed that her family was suppose to come from [place] for the birth of infant and wasn't able to due to the virus. MOB expressed that this made her very anxious as she had limited supports to be involved during her birth. CSW understanding and asked MOB how she was feeling now. MOB expressed that she feels fine now and excited that infant is here. CSW was advised by MOB that she doesn't have any mental health history and that she is not on any medications or in therapy for mental health. MOB has support from her spouse and her friends per her report. CSW offered MOB other resources and MOB declined  MOB expressed that she has all needed items to care for infant as well as she reports that she plans for infant to sleep in crib once arrived home. CSW was advised that MOB  Is not feeling SI or HI at this time. Infant is to be seen at [facility] per report of FOB and MOB.   CSW provided education regarding Baby Blues vs PMADs and provided MOB with resources for mental health follow up.  CSW encouraged MOB to evaluate her mental health throughout the postpartum period with the use of the New Mom Checklist developed by [employer]partum [employer] as well as the Edinburgh Postnatal Depression Scale and notify a medical professional if symptoms arise.       Okley Magnussen S. Tanganyika Bowlds, MSW, LCSW-A [facility] [place] [phone]  

## 2018-10-18 NOTE — Progress Notes (Signed)
CSW acknowledges consult for MOB having score of 14 on Edinburgh. CSW went to speak with MOB at bedside however RN's at bedside working with MOB at this time.. CSW to try back later.     Deniss Wormley S. Ermalee Mealy, MSW, LCSW-A [facility] [place] [phone]  

## 2018-10-18 NOTE — Anesthesia Preprocedure Evaluation (Signed)
Anesthesia Evaluation  Patient identified by MRN, date of birth, ID band Patient awake    Reviewed: Allergy & Precautions, H&P , NPO status , Patient's Chart, lab work & pertinent test results, reviewed documented beta blocker date and time   Airway Mallampati: III  TM Distance: >3 FB Neck ROM: full    Dental no notable dental hx. (+) Teeth Intact   Pulmonary neg pulmonary ROS,    Pulmonary exam normal breath sounds clear to auscultation       Cardiovascular hypertension, Normal cardiovascular exam Rhythm:regular Rate:Normal     Neuro/Psych negative neurological ROS  negative psych ROS   GI/Hepatic negative GI ROS, Neg liver ROS,   Endo/Other  Hypothyroidism   Renal/GU negative Renal ROS  negative genitourinary   Musculoskeletal   Abdominal (+) + obese,   Peds  Hematology negative hematology ROS (+)   Anesthesia Other Findings   Reproductive/Obstetrics (+) Pregnancy                             Anesthesia Physical Anesthesia Plan  ASA: III and emergent  Anesthesia Plan: Spinal   Post-op Pain Management:    Induction:   PONV Risk Score and Plan: 2  Airway Management Planned: Nasal Cannula  Additional Equipment:   Intra-op Plan:   Post-operative Plan:   Informed Consent: I have reviewed the patients History and Physical, chart, labs and discussed the procedure including the risks, benefits and alternatives for the proposed anesthesia with the patient or authorized representative who has indicated his/her understanding and acceptance.     Dental Advisory Given  Plan Discussed with: CRNA, Surgeon and Anesthesiologist  Anesthesia Plan Comments: (  )        Anesthesia Quick Evaluation

## 2018-10-18 NOTE — Progress Notes (Signed)
POSTOPERATIVE DAY # 0 S/P Primary LTCS for fetal intolerance to labor remote from delivery, GHTN, baby boy "Veer"   S:         Reports feeling okay, tired and having some incisional pain              Tolerating po intake / mild nausea with standing, but has resolved / no vomiting / no flatus / no BM  Denies HA, visual changes, or RUQ/epigastric pain   Denies dizziness, SOB, or CP             Bleeding is light             Pain controlled with Motrin and Tylenol             Up with assistance/ foley catheter in place  Newborn formula and breast feeding - SGA infant <5 lbs, primarily formula feeding; plans to pump   O:  VS: BP 111/71 (BP Location: Right Arm)   Pulse 79   Temp 97.8 F (36.6 C) (Oral)   Resp 16   SpO2 99%   Breastfeeding Unknown    LABS:               Recent Labs    10/17/18 2230 10/18/18 0613  WBC 9.8 13.5*  HGB 12.7 11.6*  PLT 239 232               Bloodtype: --/--/B POS, B POS Performed at [facility], [address] [place], [place] [address]  [phone](06/08 1330)  Rubella: Immune (12/23 0000)                                             I&O: Intake/Output      06/08 0701 - 06/09 0700 06/09 0701 - 06/10 0700   I.V. 2011    Other 0    IV Piggyback 50    Total Intake 2061    Urine 450 450   Blood 202    Total Output 652 450   Net +1409 -450                     Physical Exam:             Alert and Oriented X3  Lungs: Clear and unlabored  Heart: regular rate and rhythm / no murmurs  Abdomen: soft, non-tender, non-distended, active bowel sounds in all quadrants             Fundus: firm, non-tender, U-1             Dressing: honeycomb with steri-strips - small amount of drainage noted, otherwise c/d/i              Incision:  approximated with sutures / no erythema / no ecchymosis / no drainage  Perineum: intact; foley cath in place  Lochia: scant amount on pad   Extremities: +1 pitting ankle and pedal edema, no calf pain or tenderness, SCDs in  place  A:        POD # 0 S/P Primary LTCS            Gestational hypertension    - BPs WNL since delivery   - No neural s/s or evidence of PEC  Routine postoperative care              Lactation support -  discussed pumping  Warm liquids and ambulation to promote bowel motility  D/C foley today; okay to d/c IV after 2 voids  May shower this evening   Continue current care   Lars Pinks, MSN, CNM [facility]

## 2018-10-18 NOTE — Transfer of Care (Signed)
Immediate Anesthesia Transfer of Care Note  Patient: Emily Davidson  Procedure(s) Performed: CESAREAN SECTION (N/A )  Patient Location: PACU  Anesthesia Type:Spinal  Level of Consciousness: awake, alert  and oriented  Airway & Oxygen Therapy: Patient Spontanous Breathing  Post-op Assessment: Report given to RN and Post -op Vital signs reviewed and stable  Post vital signs: Reviewed and stable  Last Vitals:  Vitals Value Taken Time  BP    Temp    Pulse 82 10/18/2018  2:13 AM  Resp 13 10/18/2018  2:13 AM  SpO2 100 % 10/18/2018  2:13 AM  Vitals shown include unvalidated device data.  Last Pain:  Vitals:   10/17/18 2030  TempSrc:   PainSc: Asleep         Complications: No apparent anesthesia complications

## 2018-10-18 NOTE — Anesthesia Procedure Notes (Signed)
Spinal  Patient location during procedure: OR Start time: 10/18/2018 1:07 AM End time: 10/18/2018 1:13 AM Staffing Anesthesiologist: Janeece Riggers, MD Preanesthetic Checklist Completed: patient identified, site marked, surgical consent, pre-op evaluation, timeout performed, IV checked, risks and benefits discussed and monitors and equipment checked Spinal Block Patient position: sitting Prep: DuraPrep Patient monitoring: heart rate, cardiac monitor, continuous pulse ox and blood pressure Approach: midline Location: L3-4 Injection technique: single-shot Needle Needle type: Sprotte  Needle gauge: 24 G Needle length: 9 cm Assessment Sensory level: T4 Additional Notes 1st attempt at L4/5 dry tap changed to one level up     +csf/dose/+csf

## 2018-10-18 NOTE — Lactation Note (Signed)
This note was copied from a baby's chart. Lactation Consultation Note  Patient Name: Emily Davidson [name] Date: 10/18/2018 Reason for consult: Initial assessment;Early term 37-38.6wks;Primapara;1st time breastfeeding;Infant < 6lbs  P1 mother whose infant is now 28 hours old.  This is an ETI at 37+6 days weighing < 5 lbs.  Mother was holding baby when I entered the room.  As I was talking with the parents the pediatrician phoned to talk with them about transferring baby to the NICU for decreased blood sugars and poor feeding.  Discussed NICU care and provided emotional support for the parents.  RN entered the room and verified the transfer and took baby to NICU with father to accompany baby.    While father was in the NICU I explained the advantages of beginning to pump using the DEBP.  Offered to initiate the pump and mother accepted.  Pump parts, assembly, disassembly and cleaning reviewed.  Discussed pumping schedule and encouraged mother to pump at baby's bedside when visiting the NICU.  Observed mother pumping for 15 minutes and she was able to obtain a few drops from the left breast. #24 flange size is appropriate at this time. Colostrum container provided and labels given with instructions for use.  Milk storage times reviewed.  Instructed mother to rub EBM into nipples/areolas for lubrication.  Father returned after pumping and immediately took the drops back to NICU.  Mother declined using donor breast milk at this time.  Father will relay this decision to NICU RN.  Mother will be a "stay at home" mother and has a DEBP for home use.  Mom made aware of O/P services, breastfeeding support groups, community resources, and our phone # for post-discharge questions. Father present and supportive.  Mother had many questions which I answered to her satisfaction.  She will call as needed for concerns.   Maternal Data Formula Feeding for Exclusion: No Has patient been taught Hand  Expression?: Yes Does the patient have breastfeeding experience prior to this delivery?: No  Feeding Feeding Type: Bottle Fed - Formula(attempted) Nipple Type: Slow - flow  LATCH Score                   Interventions    Lactation Tools Discussed/Used WIC Program: No   Consult Status Consult Status: Follow-up Date: 10/19/18 Follow-up type: In-patient    Emily Davidson R Emily Davidson 10/18/2018, 3:13 PM

## 2018-10-19 ENCOUNTER — Encounter (HOSPITAL_COMMUNITY): Payer: Self-pay | Admitting: Obstetrics & Gynecology

## 2018-10-19 MED ORDER — HYDROCHLOROTHIAZIDE 25 MG PO TABS
25.0000 mg | ORAL_TABLET | Freq: Every day | ORAL | Status: AC
  Administered 2018-10-19 – 2018-10-20 (×2): 25 mg via ORAL
  Filled 2018-10-19 (×2): qty 1

## 2018-10-19 NOTE — Anesthesia Postprocedure Evaluation (Signed)
Anesthesia Post Note  Patient: Emily Davidson  Procedure(s) Performed: CESAREAN SECTION (N/A )     Patient location during evaluation: Mother Baby Anesthesia Type: Spinal Level of consciousness: awake and alert Pain management: pain level controlled Vital Signs Assessment: post-procedure vital signs reviewed and stable Respiratory status: spontaneous breathing, nonlabored ventilation and respiratory function stable Cardiovascular status: stable Postop Assessment: no headache, no backache and epidural receding Anesthetic complications: no    Last Vitals:  Vitals:   10/19/18 0522 10/19/18 1358  BP: 120/84 122/77  Pulse: 77 96  Resp: 20 20  Temp: 36.5 C 36.9 C  SpO2: 100%     Last Pain:  Vitals:   10/19/18 1358  TempSrc: Oral  PainSc:                  Emily Davidson

## 2018-10-19 NOTE — Progress Notes (Signed)
POSTOPERATIVE DAY # 1 S/P CS - NRFHR / gestational hypertension  Subjective:  reports feeling sore but not much pain / legs tight and hurt tolerating po intake / no nausea / no vomiting / + flatus / + small BM this am pain controlled with long-acting narcotic - started oral analgesia today Ambulation in room / Up ad lib   Newborn Breast / Female "veer" / circumcision not planned  Objective: VS: BP 120/84 (BP Location: Right Arm)   Pulse 77   Temp 97.7 F (36.5 C) (Oral)   Resp 20   SpO2 100%   Breastfeeding Unknown        BP: 120/84 - 116/74 - 106/65 - 116/75 - 111/71  LABS:  Recent Labs    10/17/18 2230 10/18/18 0613  WBC 9.8 13.5*  HGB 12.7 11.6*  PLT 239 232    Bloodtype:B pos  Rubella: Immune (12/23 0000)                                           I&O: Intake/Output      06/09 0701 - 06/10 0700 06/10 0701 - 06/11 0700   I.V. 653.8    Other     IV Piggyback     Total Intake 653.8    Urine 2150    Blood     Total Output 2150    Net -1496.2          Physical Exam: alert and oriented X3 without any distress or pain lung fields are clear and unlabored heart rate regular / normal rhythm / no mumurs abdomen soft, non-tender, non-distended  / bowel sounds are active uterine fundus firm, non-tender, Ueven surgical incision assessed with honeycomb dressing intact without drainage / well-approximated with suture / no erythema / no ecchymosis  perineum: intact lochia: light extremities: 2+ tight edema bilaterally to knees, no calf pain or tenderness  Assessment / Plan:      POD # 1 S/P CS NRFHR Gestational hypertension - no medication / BP stable / dependent edema Hypothyroidism on Synthroid  routine postoperative care  HCTZ for edema / comfort consider early DC tomorrow  [place], MSN, [facility] 10/19/2018, 10:27 AM

## 2018-10-20 NOTE — Progress Notes (Signed)
POD# 2  S: Pt notes pain controlled w/ po meds, minimal lochia, nl void, out of bed w/o dizziness or chest pain, tol reg po, + flatus. +BM. Pt notes intermittent abdominal pain and R knee pain. No HA, no vision change, no RUQ pain  Vitals:   10/19/18 0522 10/19/18 1358 10/20/18 0015 10/20/18 0558  BP: 120/84 122/77 124/85 125/82  Pulse: 77 96 97 73  Resp: 20 20 18 18   Temp: 97.7 F (36.5 C) 98.4 F (36.9 C) 98 F (36.7 C) 97.7 F (36.5 C)  TempSrc: Oral Oral Oral Oral  SpO2: 100%  100% 99%    Gen: well appearing CV: RRR Pulm: CTAB Abd: soft, ND, approp tender, fundus below umbilicus, NT, no RUQ pain, mild tympany Inc: C/D/I,  LE: tr edema, NT  CBC    Component Value Date/Time   WBC 13.5 (H) 10/18/2018 0613   RBC 3.97 10/18/2018 0613   HGB 11.6 (L) 10/18/2018 0613   HCT 34.7 (L) 10/18/2018 0613   PLT 232 10/18/2018 0613   MCV 87.4 10/18/2018 0613   MCH 29.2 10/18/2018 0613   MCHC 33.4 10/18/2018 0613   RDW 13.9 10/18/2018 0613   LYMPHSABS 1.8 10/13/2018 1510   MONOABS 0.8 10/13/2018 1510   EOSABS 0.1 10/13/2018 1510   BASOSABS 0.0 10/13/2018 1510    A/P: POD#  2 s/p PCS for arrest - post-op. Doing well.  - gest htn, nl bp's no meds at this time, f/u 6 wks PP - R knee pain, likely positional strain during prolonged epidural. recc NSAIDs, re-eval at 6 wks if still painful - baby in NICU - expect maternal d/c tomorrow  Ala Dach 10/20/2018 8:34 AM

## 2018-10-20 NOTE — Lactation Note (Signed)
This note was copied from a baby's chart. Lactation Consultation Note  Patient Name: Emily Davidson DJMEQ'A Date: 10/20/2018 Reason for consult: Follow-up assessment;Early term 37-38.6wks;NICU baby;Other (Comment)(milk is in - [place] encouraged mom to ask when baby can latch / especially since milk is in)  [facility] visited mom in her room 419.  Per mom had slept for several hours and got up and pumped off 35 ml. In the last 24 hours has pumped both breast  5-6 times and the volume has gradually increased.  [place] praised  Mom for her efforts pumping and that her milk is in.  LC reviewed and explained the DEBP - Maintenance mode and storage of breast milk. Sore nipple and engorgement  Prevention and tx reviewed.  LC mentioned to mom if she needs ice packs to call out for ice packs for her breast.  Per mom has a DEBP at home.    Maternal Data    Feeding Feeding Type: Formula Nipple Type: Dr. Myra Gianotti Preemie  LATCH Score                   Interventions Interventions: Breast feeding basics reviewed;DEBP  Lactation Tools Discussed/Used Tools: Pump Breast pump type: Double-Electric Breast Pump WIC Program: No Pump Review: Setup, frequency, and cleaning;Milk Storage;Other (comment)(LC reviewed settings on the maintance mode of the DEBP) Initiated by:: MAI - reviewed Date initiated:: 10/20/18   Consult Status Consult Status: Follow-up Date: 10/21/18 Follow-up type: In-patient    [place] 10/20/2018, 2:03 PM

## 2018-10-21 MED ORDER — OXYCODONE HCL 5 MG PO TABS: 5.0000 mg | ORAL_TABLET | Freq: Four times a day (QID) | ORAL | 0 refills | Status: AC | PRN

## 2018-10-21 MED ORDER — HYDROCHLOROTHIAZIDE 12.5 MG PO CAPS: 12.5000 mg | ORAL_CAPSULE | Freq: Every day | ORAL | 11 refills | Status: AC

## 2018-10-21 MED ORDER — IBUPROFEN 600 MG PO TABS: 600.0000 mg | ORAL_TABLET | Freq: Four times a day (QID) | ORAL | 0 refills | Status: AC | PRN

## 2018-10-21 MED ORDER — IBUPROFEN 600 MG PO TABS: 600.0000 mg | ORAL_TABLET | Freq: Four times a day (QID) | ORAL | Status: DC | PRN

## 2018-10-21 MED ORDER — LEVOTHYROXINE SODIUM 50 MCG PO TABS: 50.0000 ug | ORAL_TABLET | Freq: Every day | ORAL | 0 refills | Status: AC

## 2018-10-21 NOTE — Discharge Summary (Signed)
OB Discharge Summary  Patient Name: Emily HareRashi Hurta DOB: 03/02/1981 MRN: [id number]  Date of admission: 10/17/2018 Intrauterine pregnancy: 1228w6d   Admitting diagnosis: gestational hypertension Secondary diagnosis: AMA age 38  Date of discharge: 10/21/2018    Discharge diagnosis: Term Pregnancy Delivered, Gestational Hypertension and dependent edema     Prenatal history: G1P1001   EDC : 11/02/2018, Alternate EDD Entry  [facility]  Primary provider : Mody Prenatal course complicated by hx infertility with failed IVF, AMA-age 43, hypothyroidism, poor weight gain, poor fetal growth (23% with AC at 2%), unexplained third trimester bleeding (6/4)  Prenatal Labs: ABO, Rh: B pos Antibody: NEG (06/08 1330) Rubella: Immune (12/23 0000)   RPR: Non Reactive (06/08 1330)  HBsAg: Negative (12/23 0000)  HIV: Non-reactive (12/23 0000)  GBS: Negative (05/20 0000)                                    Hospital course:  Induction of Labor With Cesarean Section  38 y.o. yo G1P1001 at 3028w6d was admitted to the hospital 10/17/2018 for induction of labor due to elevated BP with grade 3 placenta and recent unexplained third trimester bleeding episode. IOL initiated with cytotec on 6/8 late PM. Patient had a labor course significant for repetitive FHR decelerations remote from delivery requiring operative delivery decision.  The patient went for cesarean section due to Non-Reassuring FHR, and delivered a Viable infant,10/18/2018  Membrane Rupture Time/Date: 11:36 PM ,10/17/2018  Details of operation can be found in separate operative Note.    Patient had relatively uncomplicated postpartum course, lower extremity edema improving at time of DC with HCTZ. She is ambulating, tolerating a regular diet, (+)BM x 2, and urinating well.  Patient is discharged home in stable condition on 10/21/18.                                   Delivering PROVIDER: MODY, VAISHALI                                                             Complications: None  Newborn Data: Live born female  Birth Weight: 4 lb 15.2 oz (2245 g) APGAR: 9, 9  Newborn Delivery   Birth date/time: 10/18/2018 01:30:00 Delivery type: C-Section, Low Transverse Trial of labor: Yes C-section categorization: Primary      Baby Feeding: Bottle and Breast Disposition:NICU  Post partum procedures: none  Labs: Lab Results  Component Value Date   WBC 13.5 (H) 10/18/2018   HGB 11.6 (L) 10/18/2018   HCT 34.7 (L) 10/18/2018   MCV 87.4 10/18/2018   PLT 232 10/18/2018   CMP Latest Ref Rng & Units 10/18/2018  Glucose 70 - 99 mg/dL 409(W136(H)  BUN 6 - 20 mg/dL 6  Creatinine 1.190.44 - 1.471.00 mg/dL 8.290.54  Sodium 562135 - 130145 mmol/L 135  Potassium 3.5 - 5.1 mmol/L 4.2  Chloride 98 - 111 mmol/L 104  CO2 22 - 32 mmol/L 22  Calcium 8.9 - 10.3 mg/dL 8.6(V8.5(L)  Total Protein 6.5 - 8.1 g/dL 7.8(I5.4(L)  Total Bilirubin 0.3 - 1.2 mg/dL 0.5  Alkaline Phos  38 - 126 U/L 178(H)  AST 15 - 41 U/L 19  ALT 0 - 44 U/L 13    Physical Exam @ time of discharge:  Vitals:   10/20/18 0558 10/20/18 1625 10/20/18 2209 10/21/18 0525  BP: 125/82 120/78 122/76 124/88  Pulse: 73 64 76 90  Resp: 18 18 18 18   Temp: 97.7 F (36.5 C) 98.2 F (36.8 C) 98.1 F (36.7 C) 98 F (36.7 C)  TempSrc: Oral Oral Oral Oral  SpO2: 99%   100%   general: alert, cooperative and no distress lochia: appropriate uterine fundus: firm perineum: intact incision: Healing well with no significant drainage Extremities: no varicosities, improving edema 1+ pretibial and 2+ pedal DVT Evaluation: No evidence of DVT seen on physical exam.  Discharge instructions:  "Baby and Me Booklet" and [place] Booklet Discharge Medications:  Allergies as of 10/21/2018   No Known Allergies     Medication List    TAKE these medications   hydrochlorothiazide 12.5 MG capsule Commonly known as: Microzide Take 1 capsule (12.5 mg total) by mouth daily for 3 days.   ibuprofen 600 MG  tablet Commonly known as: ADVIL Take 1 tablet (600 mg total) by mouth every 6 (six) hours as needed for moderate pain.   levothyroxine 50 MCG tablet Commonly known as: SYNTHROID Take 1 tablet (50 mcg total) by mouth daily at 6 (six) AM. Start taking on: October 22, 2018   oxyCODONE 5 MG immediate release tablet Commonly known as: Oxy IR/ROXICODONE Take 1 tablet (5 mg total) by mouth every 6 (six) hours as needed for up to 5 days for moderate pain.   prenatal multivitamin Tabs tablet Take 1 tablet by mouth daily at 12 noon.            Discharge Care Instructions  (From admission, onward)         Start     Ordered   10/21/18 0000  Discharge wound care:    Comments: Leave honeycomb in place for 5 days - remove if get wet in shower. APT at [facility] Monday to remove Leave steri-strips in place x 2 weeks. Keep incision clean and dry   10/21/18 1017         Diet: routine diet Activity: Advance as tolerated. Pelvic rest x 6 weeks.  Follow HL:KTGYBW at [place]: Artelia Laroche CNM, MSN, [facility] 10/21/2018, 10:18 AM

## 2018-10-21 NOTE — Progress Notes (Signed)
POSTOPERATIVE DAY # 3 S/P CS  Subjective:  reports feeling well - swelling better but legs still tight / knee pain better tolerating po intake / no nausea / no vomiting / + BM up ad lib / ambulatory in hallways - some sharp pain RLLQ with twisting & walking / voiding QS  Newborn female /  Bottle and Breast / admit to NICU for 4-7 days  Objective: VS: BP 124/88 (BP Location: Right Arm)   Pulse 90   Temp 98 F (36.7 C) (Oral)   Resp 18   SpO2 100%   Breastfeeding Unknown        LABS:   Bloodtype: B pos  Rubella: Immune (12/23 0000)                     Physical Exam: alert and oriented X3 without any distress or pain lung fields are clear and unlabored heart rate regular / normal rhythm / no mumurs abdomen soft, non-tender, non-distended  / bowel sounds are active uterine fundus firm, non-tender, Ueven surgical incision assessed with honeycomb dressing intact without drainage / well-approximated with suture / no erythema / no ecchymosis  extremities: 1+ edema with 2+ pedal remaining , no calf pain or tenderness, negative Homans  Assessment / Plan:          POD # 3 S/P CS Gestational hypertension - BP stable without antihypertensive medication  DC home - post-op home care reviewed / prefers to come to office to remove dressing dependent edema-increase water intake/ reduce salt in diet / ELEVATE feet when sitting/ HCTZ 12.5 x 3days BP& edema recheck Monday at [place], MSN, [facility] 10/21/2018, 8:08 AM

## 2018-10-21 NOTE — Progress Notes (Signed)
Patient screened out for psychosocial assessment since none of the following apply:  Psychosocial stressors documented in mother or baby's chart  Gestation less than 32 weeks  Code at delivery   Infant with anomalies  Please contact the Clinical Social Worker if needs arise, by [facility] request, or if MOB scores greater than 9/yes to question 10 on Edinburgh Postpartum Depression Screen. MOB scored 14 on the edinburgh and has already been assessed by CSW.   Abundio Miu, [place] Worker [facility] Cell#: [phone]

## 2018-10-21 NOTE — Lactation Note (Signed)
This note was copied from a baby's chart. Lactation Consultation Note  Patient Name: Emily Davidson [id number]'[id number] Date: 10/21/2018 Reason for consult: Follow-up assessment;Early term 37-38.6wks;NICU baby;Infant < 6lbs;Other (Comment)(milk in bilaterally/ per mom no engorgement over night)  Baby is 16 hours old  [name] visited mom in her room 1  Per mom has been pumping at least every 3 hours since [facility] saw her yesterday and breast are fuller and not engorged.  LC reviewed supply and demand and mentioned to mom by 7 days how much milk to be pumped of is expected to know  Her supply is at the level it should be.  Mom denies soreness or engorgement.  LC reviewed storage for when baby is in NICU and when the baby is discharged.  Mom aware to obtain breast milk labels in NICU and has plenty of storage bottles.  Mom aware to transport her milk in a cooler to the hospital.  [place] reviewed lactation resources @ [place].    Maternal Data Has patient been taught Hand Expression?: Yes  Feeding Feeding Type: Breast Milk Nipple Type: Dr. Myra Gianotti Preemie  LATCH Score                   Interventions Interventions: Breast feeding basics reviewed;DEBP  Lactation Tools Discussed/Used Tools: Pump Breast pump type: Double-Electric Breast Pump Pump Review: Setup, frequency, and cleaning;Milk Storage(LC reviewed)   Consult Status Consult Status: PRN Follow-up type: Other (comment)(baby in NICU and mom aware she can call or have the NICU RN call [place])    [place] 10/21/2018, 8:59 AM

## 2018-11-29 DIAGNOSIS — E039 Hypothyroidism, unspecified: Secondary | ICD-10-CM | POA: Diagnosis not present

## 2019-08-11 ENCOUNTER — Ambulatory Visit: Payer: 59 | Attending: Internal Medicine

## 2019-08-11 DIAGNOSIS — Z23 Encounter for immunization: Secondary | ICD-10-CM

## 2019-08-11 NOTE — Progress Notes (Signed)
   Covid-19 Vaccination Clinic  Name:  Emily Davidson    MRN: [id number] DOB: 29-Dec-1980  08/11/2019  Emily Davidson was observed post Covid-19 immunization for 15 minutes without incident. She was provided with Vaccine Information Sheet and instruction to access the V-Safe system.   Emily Davidson was instructed to call 911 with any severe reactions post vaccine: [place] Difficulty breathing  . Swelling of face and throat  . A fast heartbeat  . A bad rash all over body  . Dizziness and weakness   Immunizations Administered    Name Date Dose VIS Date Route   Pfizer COVID-19 Vaccine 08/11/2019  3:12 PM 0.3 mL 04/21/2019 Intramuscular   Manufacturer: [employer], [employer]   Lot: [id number]   NDC: [address]-8

## 2019-09-04 ENCOUNTER — Ambulatory Visit: Payer: 59 | Attending: Internal Medicine

## 2019-09-04 DIAGNOSIS — Z23 Encounter for immunization: Secondary | ICD-10-CM

## 2019-09-04 NOTE — Progress Notes (Signed)
   Covid-19 Vaccination Clinic  Name:  Emily Davidson    MRN: [id number] DOB: 1980/08/29  09/04/2019  Emily Davidson was observed post Covid-19 immunization for 15 minutes without incident. She was provided with Vaccine Information Sheet and instruction to access the V-Safe system.   Emily Davidson was instructed to call 911 with any severe reactions post vaccine: [place] Kitchen Difficulty breathing  . Swelling of face and throat  . A fast heartbeat  . A bad rash all over body  . Dizziness and weakness   Immunizations Administered    Name Date Dose VIS Date Route   Pfizer COVID-19 Vaccine 09/04/2019  3:54 PM 0.3 mL 07/05/2018 Intramuscular   Manufacturer: [employer], [employer]   Lot: [id number]   NDC: [address]-0

## 2021-11-21 ENCOUNTER — Other Ambulatory Visit (HOSPITAL_BASED_OUTPATIENT_CLINIC_OR_DEPARTMENT_OTHER): Payer: Self-pay | Admitting: Internal Medicine

## 2021-11-21 ENCOUNTER — Other Ambulatory Visit: Payer: Self-pay | Admitting: Internal Medicine

## 2021-11-21 DIAGNOSIS — N921 Excessive and frequent menstruation with irregular cycle: Secondary | ICD-10-CM

## 2021-12-05 ENCOUNTER — Ambulatory Visit (HOSPITAL_BASED_OUTPATIENT_CLINIC_OR_DEPARTMENT_OTHER): Admission: RE | Admit: 2021-12-05 | Payer: No Typology Code available for payment source | Source: Ambulatory Visit

## 2021-12-05 ENCOUNTER — Encounter (HOSPITAL_BASED_OUTPATIENT_CLINIC_OR_DEPARTMENT_OTHER): Payer: Self-pay
